# Patient Record
Sex: Male | Born: 1985 | Race: Black or African American | Hispanic: No | Marital: Single | Smoking: Current every day smoker
Health system: Southern US, Community
[De-identification: ages and names within clinical notes are randomized; demographics above are authoritative.]

---
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
## 2006-04-08 ENCOUNTER — Emergency Department (HOSPITAL_COMMUNITY): Admission: EM | Admit: 2006-04-08 | Discharge: 2006-04-08 | Payer: Self-pay | Admitting: Emergency Medicine

## 2009-08-20 ENCOUNTER — Emergency Department (HOSPITAL_COMMUNITY): Admission: EM | Admit: 2009-08-20 | Discharge: 2009-08-20 | Payer: Self-pay | Admitting: Emergency Medicine

## 2010-12-19 ENCOUNTER — Emergency Department (HOSPITAL_COMMUNITY)
Admission: EM | Admit: 2010-12-19 | Discharge: 2010-12-19 | Disposition: A | Discharge status: Home / Self Care | Payer: Self-pay | Attending: Emergency Medicine | Admitting: Emergency Medicine

## 2010-12-19 DIAGNOSIS — R21 Rash and other nonspecific skin eruption: Secondary | ICD-10-CM | POA: Insufficient documentation

## 2010-12-19 DIAGNOSIS — J069 Acute upper respiratory infection, unspecified: Secondary | ICD-10-CM | POA: Insufficient documentation

## 2011-01-01 LAB — URINALYSIS, ROUTINE W REFLEX MICROSCOPIC
Bilirubin Urine: NEGATIVE
Glucose, UA: NEGATIVE mg/dL
Ketones, ur: NEGATIVE mg/dL
Nitrite: NEGATIVE
Specific Gravity, Urine: 1.025 (ref 1.005–1.030)
Urobilinogen, UA: 1 mg/dL (ref 0.0–1.0)
pH: 6 (ref 5.0–8.0)

## 2011-01-01 LAB — URINE MICROSCOPIC-ADD ON

## 2011-04-03 ENCOUNTER — Emergency Department (HOSPITAL_COMMUNITY)
Admission: EM | Admit: 2011-04-03 | Discharge: 2011-04-03 | Disposition: A | Discharge status: Home / Self Care | Payer: Self-pay | Attending: Emergency Medicine | Admitting: Emergency Medicine

## 2011-04-03 DIAGNOSIS — F17200 Nicotine dependence, unspecified, uncomplicated: Secondary | ICD-10-CM | POA: Insufficient documentation

## 2011-04-03 DIAGNOSIS — J329 Chronic sinusitis, unspecified: Secondary | ICD-10-CM | POA: Insufficient documentation

## 2012-02-24 ENCOUNTER — Encounter (HOSPITAL_COMMUNITY): Payer: Self-pay | Admitting: *Deleted

## 2012-02-24 ENCOUNTER — Emergency Department (HOSPITAL_COMMUNITY)
Admission: EM | Admit: 2012-02-24 | Discharge: 2012-02-24 | Disposition: A | Discharge status: Home / Self Care | Payer: Self-pay | Attending: Emergency Medicine | Admitting: Emergency Medicine

## 2012-02-24 DIAGNOSIS — R51 Headache: Secondary | ICD-10-CM | POA: Insufficient documentation

## 2012-02-24 DIAGNOSIS — F17200 Nicotine dependence, unspecified, uncomplicated: Secondary | ICD-10-CM | POA: Insufficient documentation

## 2012-02-24 MED ORDER — HYDROCODONE-ACETAMINOPHEN 5-325 MG PO TABS: 1.0000 | ORAL_TABLET | ORAL | Status: AC | PRN

## 2012-02-24 MED ORDER — MELOXICAM 7.5 MG PO TABS: ORAL_TABLET | ORAL | Status: DC

## 2012-02-24 MED ORDER — PSEUDOEPHEDRINE HCL 60 MG PO TABS: ORAL_TABLET | ORAL | Status: DC

## 2012-02-24 NOTE — ED Provider Notes (Signed)
Medical screening examination/treatment/procedure(s) were performed by non-physician practitioner and as supervising physician I was immediately available for consultation/collaboration.   Glynn Octave, MD 02/24/12 [phone]

## 2012-02-24 NOTE — Discharge Instructions (Signed)
Sinus Headache  A sinus headache is when your sinuses become clogged or swollen. Sinus headaches can range from mild to severe.   CAUSES  A sinus headache can have different causes, such as:   Colds.   Sinus infections.   Allergies.  SYMPTOMS   Symptoms of a sinus headache may vary and can include:   Headache.   Pain or pressure in the face.   Congested or runny nose.   Fever.   Inability to smell.   Pain in upper teeth.  Weather changes can make symptoms worse.  TREATMENT   The treatment of a sinus headache depends on the cause.   Sinus pain caused by a sinus infection may be treated with antibiotic medicine.   Sinus pain caused by allergies may be helped by allergy medicines (antihistamines) and medicated nasal sprays.   Sinus pain caused by congestion may be helped by flushing the nose and sinuses with saline solution.  HOME CARE INSTRUCTIONS    If antibiotics are prescribed, take them as directed. Finish them even if you start to feel better.   Only take over-the-counter or prescription medicines for pain, discomfort, or fever as directed by your caregiver.   If you have congestion, use a nasal spray to help reduce pressure.  SEEK IMMEDIATE MEDICAL CARE IF:   You have a fever.   You have headaches more than once a week.   You have sensitivity to light or sound.   You have repeated nausea and vomiting.   You have vision problems.   You have sudden, severe pain in your face or head.   You have a seizure.   You are confused.   Your sinus headaches do not get better after treatment. Many people think they have a sinus headache when they actually have migraines or tension headaches.  MAKE SURE YOU:    Understand these instructions.   Will watch your condition.   Will get help right away if you are not doing well or get worse.  Document Released: 10/23/2004 Document Revised: 09/04/2011 Document Reviewed: 12/14/2010  [employer] Patient Information 2012 [employer].

## 2012-02-24 NOTE — ED Notes (Signed)
Headache, "feels feverish",  Nausea this am,  No HI.

## 2012-02-24 NOTE — ED Provider Notes (Signed)
History     CSN: [id number]  Arrival date & time 02/24/12  1302   First MD Initiated Contact with Patient 02/24/12 1342      Chief Complaint  Patient presents with  . Headache    (Consider location/radiation/quality/duration/timing/severity/associated sxs/prior treatment) Patient is a 26 y.o. male presenting with headaches. The history is provided by the patient.  Headache  This is a new problem. The current episode started more than 2 days ago. The problem occurs hourly. The problem has not changed since onset.The headache is associated with bright light (nasal congestion). The pain is located in the bilateral region. The quality of the pain is described as dull. The pain is moderate. Associated symptoms include malaise/fatigue. Pertinent negatives include no near-syncope, no palpitations, no syncope, no shortness of breath and no nausea. He has tried nothing for the symptoms.    History reviewed. No pertinent past medical history.  History reviewed. No pertinent past surgical history.  History reviewed. No pertinent family history.  History  Substance Use Topics  . Smoking status: Current Everyday Smoker  . Smokeless tobacco: Not on file  . Alcohol Use: Yes      Review of Systems  Constitutional: Positive for malaise/fatigue. Negative for activity change.       All ROS Neg except as noted in HPI  HENT: Negative for nosebleeds and neck pain.   Eyes: Negative for photophobia and discharge.  Respiratory: Negative for cough, shortness of breath and wheezing.   Cardiovascular: Negative for chest pain, palpitations, syncope and near-syncope.  Gastrointestinal: Negative for nausea, abdominal pain and blood in stool.  Genitourinary: Negative for dysuria, frequency and hematuria.  Musculoskeletal: Negative for back pain and arthralgias.  Skin: Negative.   Neurological: Positive for headaches. Negative for dizziness, seizures and speech difficulty.  Psychiatric/Behavioral:  Negative for hallucinations and confusion.    Allergies  Review of patient's allergies indicates no known allergies.  Home Medications  No current outpatient prescriptions on file.  BP 135/69  Pulse 66  Temp(Src) 98.1 F (36.7 C) (Oral)  Resp 18  Ht 5\' 11"  (1.803 m)  Wt 141 lb 3 oz (64.042 kg)  BMI 19.69 kg/m2  SpO2 97%  Physical Exam  Nursing note and vitals reviewed. Constitutional: He is oriented to person, place, and time. He appears well-developed and well-nourished.  Non-toxic appearance.  HENT:  Head: Normocephalic.  Right Ear: Tympanic membrane and external ear normal.  Left Ear: Tympanic membrane and external ear normal.       Nasal congestion  Eyes: EOM and lids are normal. Pupils are equal, round, and reactive to light.  Neck: Normal range of motion. Neck supple. Carotid bruit is not present.  Cardiovascular: Normal rate, regular rhythm, normal heart sounds, intact distal pulses and normal pulses.   Pulmonary/Chest: Breath sounds normal. No respiratory distress.  Abdominal: Soft. Bowel sounds are normal. There is no tenderness. There is no guarding.  Musculoskeletal: Normal range of motion.  Lymphadenopathy:       Head (right side): No submandibular adenopathy present.       Head (left side): No submandibular adenopathy present.    He has no cervical adenopathy.  Neurological: He is alert and oriented to person, place, and time. He has normal strength. No cranial nerve deficit or sensory deficit. He exhibits normal muscle tone. Coordination normal.  Skin: Skin is warm and dry.  Psychiatric: He has a normal mood and affect. His speech is normal.    ED Course  Procedures (including critical  care time)  Labs Reviewed - No data to display No results found.   No diagnosis found.    MDM  I have reviewed nursing notes, vital signs, and all appropriate lab and imaging results for this patient. Rx for sudafed, norco, and meloxicam given to the  patient.       Kathie Dike, [place] 02/24/12 1410

## 2012-08-29 ENCOUNTER — Encounter (HOSPITAL_COMMUNITY): Payer: Self-pay | Admitting: Emergency Medicine

## 2012-08-29 ENCOUNTER — Emergency Department (HOSPITAL_COMMUNITY)
Admission: EM | Admit: 2012-08-29 | Discharge: 2012-08-29 | Disposition: A | Discharge status: Home / Self Care | Payer: Self-pay | Attending: Emergency Medicine | Admitting: Emergency Medicine

## 2012-08-29 DIAGNOSIS — Y990 Civilian activity done for income or pay: Secondary | ICD-10-CM | POA: Insufficient documentation

## 2012-08-29 DIAGNOSIS — F17200 Nicotine dependence, unspecified, uncomplicated: Secondary | ICD-10-CM | POA: Insufficient documentation

## 2012-08-29 DIAGNOSIS — S46911A Strain of unspecified muscle, fascia and tendon at shoulder and upper arm level, right arm, initial encounter: Secondary | ICD-10-CM

## 2012-08-29 DIAGNOSIS — Y9269 Other specified industrial and construction area as the place of occurrence of the external cause: Secondary | ICD-10-CM | POA: Insufficient documentation

## 2012-08-29 DIAGNOSIS — M545 Low back pain, unspecified: Secondary | ICD-10-CM | POA: Insufficient documentation

## 2012-08-29 DIAGNOSIS — IMO0002 Reserved for concepts with insufficient information to code with codable children: Secondary | ICD-10-CM | POA: Insufficient documentation

## 2012-08-29 DIAGNOSIS — X500XXA Overexertion from strenuous movement or load, initial encounter: Secondary | ICD-10-CM | POA: Insufficient documentation

## 2012-08-29 DIAGNOSIS — Y9389 Activity, other specified: Secondary | ICD-10-CM | POA: Insufficient documentation

## 2012-08-29 MED ORDER — CYCLOBENZAPRINE HCL 10 MG PO TABS: 10.0000 mg | ORAL_TABLET | Freq: Three times a day (TID) | ORAL | Status: DC | PRN

## 2012-08-29 MED ORDER — NAPROXEN 500 MG PO TABS: 500.0000 mg | ORAL_TABLET | Freq: Two times a day (BID) | ORAL | Status: DC

## 2012-08-29 NOTE — ED Notes (Signed)
Pt c/o right shoulder and lower back pain that began yesterday. Pt states pain gradually worsened over day. Pt describes pain as "pulling and tight".

## 2012-08-29 NOTE — ED Provider Notes (Signed)
History     CSN: [id number]  Arrival date & time 08/29/12  1012   First MD Initiated Contact with Patient 08/29/12 1117      Chief Complaint  Patient presents with  . Back Pain  . Shoulder Pain    (Consider location/radiation/quality/duration/timing/severity/associated sxs/prior treatment) HPI Comments: Patient complains of diffuse lower back pain and right shoulder pain that began one day prior to ED arrival. He states the pain began after he was working on a car. 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  Pain is worse with certain movements and improves with rest. He denies numbness or weakness to the upper or lower extremities, neck pain, headache, dizziness, dysuria, incontinence, or saddle anesthesias. He is not taking any over-the-counter medications or tried any therapies to reduce the pain.  Patient is a 26 y.o. male presenting with back pain. The history is provided by the patient.  Back Pain  This is a new problem. Episode onset: day PTA. The problem occurs constantly. The problem has not changed since onset.The pain is associated with lifting heavy objects and twisting. The pain is present in the lumbar spine (right shoulder). The quality of the pain is described as aching. The pain does not radiate. The pain is moderate. The symptoms are aggravated by bending, twisting and certain positions. Pertinent negatives include no chest pain, no fever, no numbness, no headaches, no abdominal pain, no abdominal swelling, no bowel incontinence, no perianal numbness, no dysuria, no pelvic pain, no leg pain, no paresthesias, no paresis, no tingling and no weakness. He has tried nothing for the symptoms. The treatment provided no relief.    History reviewed. No pertinent past medical history.  History reviewed. No pertinent past surgical history.  No family history on file.  History  Substance Use Topics  . Smoking  status: Current Every Day Smoker    Types: Cigarettes  . Smokeless tobacco: Not on file  . Alcohol Use: Yes      Review of Systems  Constitutional: Negative for fever and chills.  HENT: Negative for facial swelling, neck pain and neck stiffness.   Respiratory: Negative for chest tightness and shortness of breath.   Cardiovascular: Negative for chest pain.  Gastrointestinal: Negative for vomiting, abdominal pain, constipation and bowel incontinence.  Genitourinary: Negative for dysuria, hematuria, flank pain, decreased urine volume, difficulty urinating and pelvic pain.       No perineal numbness or incontinence of urine or feces  Musculoskeletal: Positive for back pain and arthralgias. Negative for joint swelling and gait problem.  Skin: Negative for color change, rash and wound.  Neurological: Negative for dizziness, tingling, facial asymmetry, weakness, light-headedness, numbness, headaches and paresthesias.  All other systems reviewed and are negative.    Allergies  Review of patient's allergies indicates no known allergies.  Home Medications  No current outpatient prescriptions on file.  BP 91/69  Pulse 66  Temp 98.2 F (36.8 C) (Oral)  Resp 18  Ht 5\' 11"  (1.803 m)  Wt 145 lb (65.772 kg)  BMI 20.22 kg/m2  SpO2 100%  Physical Exam  Nursing note and vitals reviewed. Constitutional: He is oriented to person, place, and time. He appears well-developed and well-nourished. No distress.  HENT:  Head: Normocephalic and atraumatic.  Neck: Normal range of motion. Neck supple.  Cardiovascular: Normal rate, regular rhythm, normal heart sounds and intact distal pulses.   No murmur heard. Pulmonary/Chest:  Effort normal and breath sounds normal. No respiratory distress. He exhibits tenderness. He exhibits no mass, no bony tenderness, no laceration, no crepitus, no edema, no deformity, no swelling and no retraction.         Localized tenderness to palpation along the upper right  chest wall. No crepitus or edema. Lungs are clear to auscultation bilaterally.  Musculoskeletal: He exhibits tenderness. He exhibits no edema.       Lumbar back: He exhibits tenderness and pain. He exhibits normal range of motion, no swelling, no deformity, no laceration and normal pulse.       Back:       Tenderness to palpation along the right scapular border and right trapezius muscle. He also has tenderness to palpation of the lumbar paraspinal muscles. DP pulses are equal and brisk, distal sensation is intact, radial pulse is also brisk, cap refill is less than 2 seconds. Grip strength is strong and equal bilaterally. Pain to the right shoulder area is reproduced with abduction of the arm.  Neurological: He is alert and oriented to person, place, and time. No cranial nerve deficit or sensory deficit. He exhibits normal muscle tone. Coordination and gait normal.  Reflex Scores:      Tricep reflexes are 2+ on the right side and 2+ on the left side.      Bicep reflexes are 2+ on the right side and 2+ on the left side.      Patellar reflexes are 2+ on the right side and 2+ on the left side.      Achilles reflexes are 2+ on the right side and 2+ on the left side. Skin: Skin is warm and dry.    ED Course  Procedures (including critical care time)  Labs Reviewed - No data to display No results found.      MDM    ttp of the right upper chest wall and along the border of the right scapula.  Pain is reproduced with shoulder movement  Patient has ttp of the lumbar paraspinal muscles.  No focal neuro deficits on exam.  Ambulates with a steady gait.     Doubt emergent neurological or infectious process. Pain is likely related to muscular strain. I will treat with anti-inflammatory and muscle relaxer. Patient agrees to rest and ice to the affected areas.      Fenix Ruppe L. Zyree Traynham, [place] 08/30/12 1750

## 2012-08-29 NOTE — ED Notes (Signed)
Pt c/o lower back pain and shoulder pain since yesterday. Pt denies any injury.

## 2012-08-31 NOTE — ED Provider Notes (Signed)
Medical screening examination/treatment/procedure(s) were performed by non-physician practitioner and as supervising physician I was immediately available for consultation/collaboration.   Dione Booze, MD 08/31/12 [phone]

## 2012-09-21 ENCOUNTER — Emergency Department (HOSPITAL_COMMUNITY)
Admission: EM | Admit: 2012-09-21 | Discharge: 2012-09-21 | Disposition: A | Discharge status: Home / Self Care | Payer: Self-pay | Attending: Emergency Medicine | Admitting: Emergency Medicine

## 2012-09-21 ENCOUNTER — Emergency Department (HOSPITAL_COMMUNITY): Payer: Self-pay

## 2012-09-21 ENCOUNTER — Encounter (HOSPITAL_COMMUNITY): Payer: Self-pay | Admitting: Emergency Medicine

## 2012-09-21 DIAGNOSIS — G8929 Other chronic pain: Secondary | ICD-10-CM | POA: Insufficient documentation

## 2012-09-21 DIAGNOSIS — M25519 Pain in unspecified shoulder: Secondary | ICD-10-CM

## 2012-09-21 DIAGNOSIS — F1210 Cannabis abuse, uncomplicated: Secondary | ICD-10-CM | POA: Insufficient documentation

## 2012-09-21 DIAGNOSIS — F17200 Nicotine dependence, unspecified, uncomplicated: Secondary | ICD-10-CM | POA: Insufficient documentation

## 2012-09-21 MED ORDER — MELOXICAM 7.5 MG PO TABS: ORAL_TABLET | ORAL | Status: DC

## 2012-09-21 MED ORDER — HYDROCODONE-ACETAMINOPHEN 5-325 MG PO TABS: 1.0000 | ORAL_TABLET | ORAL | Status: AC | PRN

## 2012-09-21 NOTE — ED Provider Notes (Signed)
Medical screening examination/treatment/procedure(s) were performed by non-physician practitioner and as supervising physician I was immediately available for consultation/collaboration.   Tanyah Debruyne L Stephens [place], MD 09/21/12 1415 

## 2012-09-21 NOTE — ED Provider Notes (Signed)
History     CSN: [id number]  Arrival date & time 09/21/12  4098   First MD Initiated Contact with Patient 09/21/12 [phone]      Chief Complaint  Patient presents with  . Shoulder Pain    (Consider location/radiation/quality/duration/timing/severity/associated sxs/prior treatment) Patient is a 26 y.o. male presenting with shoulder pain. The history is provided by the patient.  Shoulder Pain This is a chronic problem. The current episode started more than 1 month ago. The problem occurs daily. The problem has been gradually worsening.    History reviewed. No pertinent past medical history.  History reviewed. No pertinent past surgical history.  History reviewed. No pertinent family history.  History  Substance Use Topics  . Smoking status: Current Every Day Smoker    Types: Cigarettes  . Smokeless tobacco: Not on file  . Alcohol Use: Yes     Comment: occ      Review of Systems  Allergies  Review of patient's allergies indicates no known allergies.  Home Medications   Current Outpatient Rx  Name  Route  Sig  Dispense  Refill  . CYCLOBENZAPRINE HCL 10 MG PO TABS   Oral   Take 1 tablet (10 mg total) by mouth 3 (three) times daily as needed for muscle spasms.   21 tablet   0   . NAPROXEN 500 MG PO TABS   Oral   Take 1 tablet (500 mg total) by mouth 2 (two) times daily with a meal.   20 tablet   0     BP 115/72  Pulse 63  Temp 98 F (36.7 C) (Oral)  Resp 16  SpO2 99%  Physical Exam  Musculoskeletal:       Soreness and mild to mod crepitus of the right shoulder with ROM. No dislocation. The shoulder is not hot. Radial pulse symmetrical.  Neurological:       Grip and sensory symmetrical    ED Course  Procedures (including critical care time)  Labs Reviewed - No data to display No results found.   No diagnosis found.    MDM  I have reviewed nursing notes, vital signs, and all appropriate lab and imaging results for this patient. X-ray of the  shoulder is normal. Have advised patient to see the orthopedic physician for additional evaluation concerning his shoulder. Will treat the patient with Mobic and Norco for pain. Patient advised that this is only a temporary treatment and should not be substituted for evaluation and management by an orthopedist.       Kathie Dike, PA 09/21/12 1023

## 2012-09-21 NOTE — ED Notes (Signed)
Pt here x one month ago for same. Pt c/o r shoulder pain. Dx with muscle strain. Unable to f/u with ortho due to finances. Pt states is still a nagging constant pain with rotation and reaching. Nad. Radial pulses strong bilateral, no obvious deformities.

## 2012-12-06 ENCOUNTER — Encounter (HOSPITAL_COMMUNITY): Payer: Self-pay | Admitting: *Deleted

## 2012-12-06 ENCOUNTER — Emergency Department (HOSPITAL_COMMUNITY)
Admission: EM | Admit: 2012-12-06 | Discharge: 2012-12-06 | Disposition: A | Discharge status: Home / Self Care | Payer: Self-pay | Attending: Emergency Medicine | Admitting: Emergency Medicine

## 2012-12-06 DIAGNOSIS — R141 Gas pain: Secondary | ICD-10-CM | POA: Insufficient documentation

## 2012-12-06 DIAGNOSIS — R142 Eructation: Secondary | ICD-10-CM | POA: Insufficient documentation

## 2012-12-06 DIAGNOSIS — F17200 Nicotine dependence, unspecified, uncomplicated: Secondary | ICD-10-CM | POA: Insufficient documentation

## 2012-12-06 DIAGNOSIS — F1210 Cannabis abuse, uncomplicated: Secondary | ICD-10-CM | POA: Insufficient documentation

## 2012-12-06 DIAGNOSIS — E860 Dehydration: Secondary | ICD-10-CM | POA: Insufficient documentation

## 2012-12-06 DIAGNOSIS — R197 Diarrhea, unspecified: Secondary | ICD-10-CM | POA: Insufficient documentation

## 2012-12-06 DIAGNOSIS — R112 Nausea with vomiting, unspecified: Secondary | ICD-10-CM | POA: Insufficient documentation

## 2012-12-06 DIAGNOSIS — R5381 Other malaise: Secondary | ICD-10-CM | POA: Insufficient documentation

## 2012-12-06 DIAGNOSIS — R1084 Generalized abdominal pain: Secondary | ICD-10-CM | POA: Insufficient documentation

## 2012-12-06 LAB — CBC WITH DIFFERENTIAL/PLATELET
Basophils Relative: 0 % (ref 0–1)
Eosinophils Absolute: 0 10*3/uL (ref 0.0–0.7)
HCT: 42.7 % (ref 39.0–52.0)
Hemoglobin: 14.4 g/dL (ref 13.0–17.0)
MCH: 30.3 pg (ref 26.0–34.0)
MCHC: 33.7 g/dL (ref 30.0–36.0)
Monocytes Absolute: 0.4 10*3/uL (ref 0.1–1.0)
Monocytes Relative: 12 % (ref 3–12)
Neutro Abs: 1.6 10*3/uL — ABNORMAL LOW (ref 1.7–7.7)

## 2012-12-06 LAB — BASIC METABOLIC PANEL
BUN: 14 mg/dL (ref 6–23)
Chloride: 103 mEq/L (ref 96–112)
Creatinine, Ser: 0.82 mg/dL (ref 0.50–1.35)
GFR calc Af Amer: >90 mL/min (ref >90)
Glucose, Bld: 95 mg/dL (ref 70–99)

## 2012-12-06 MED ORDER — ONDANSETRON HCL 4 MG/2ML IJ SOLN
4.0000 mg | Freq: Once | INTRAMUSCULAR | Status: AC
  Administered 2012-12-06: 4 mg via INTRAVENOUS
  Filled 2012-12-06: qty 2

## 2012-12-06 MED ORDER — SODIUM CHLORIDE 0.9 % IV SOLN: 1000.0000 mL | INTRAVENOUS | Status: DC

## 2012-12-06 MED ORDER — ONDANSETRON HCL 4 MG PO TABS: 4.0000 mg | ORAL_TABLET | Freq: Four times a day (QID) | ORAL | Status: DC

## 2012-12-06 MED ORDER — SODIUM CHLORIDE 0.9 % IV SOLN
1000.0000 mL | Freq: Once | INTRAVENOUS | Status: AC
  Administered 2012-12-06: 1000 mL via INTRAVENOUS

## 2012-12-06 MED ORDER — ONDANSETRON HCL 4 MG/2ML IJ SOLN
INTRAMUSCULAR | Status: AC
  Administered 2012-12-06: 4 mg via INTRAVENOUS
  Filled 2012-12-06: qty 2

## 2012-12-06 MED ORDER — SODIUM CHLORIDE 0.9 % IV SOLN: 1000.0000 mL | Freq: Once | INTRAVENOUS | Status: DC

## 2012-12-06 MED ORDER — ONDANSETRON HCL 4 MG/2ML IJ SOLN
4.0000 mg | Freq: Once | INTRAMUSCULAR | Status: AC
  Administered 2012-12-06: 4 mg via INTRAVENOUS

## 2012-12-06 MED ORDER — SODIUM CHLORIDE 0.9 % IV BOLUS (SEPSIS): 1000.0000 mL | Freq: Once | INTRAVENOUS | Status: DC

## 2012-12-06 NOTE — ED Notes (Signed)
Pt reports still feeling nauseated.  meds given.

## 2012-12-06 NOTE — ED Provider Notes (Signed)
History    This chart was scribed for Ward Givens, MD by Charolett Bumpers, ED Scribe. The patient was seen in room APA05/[facility]05. Patient's care was started at 1206.   CSN: [id number]  Arrival date & time 12/06/12  1030   First MD Initiated Contact with Patient 12/06/12 1206      Chief Complaint  Patient presents with  . Emesis    The history is provided by the patient. No language interpreter was used.   Jeremy Sullivan is a 27 y.o. male who presents to the Emergency Department complaining of 2 days of persistent, moderate vomiting and diarrhea. He reports vomiting twice last night and once this morning. He reports one episode of diarrhea last night and 2 episodes this morning. He also complains of diffuse abdominal cramping pain with gas, generalized weakness. He denies any dizziness, light-headedness. He is unsure of fever. He denies any sick contacts or suspect food exposures. He states that he is otherwise healthy and doesn't have a PCP.   PCP none  History reviewed. No pertinent past medical history.  History reviewed. No pertinent past surgical history.  History reviewed. No pertinent family history.  History  Substance Use Topics  . Smoking status: Current Every Day Smoker    Types: Cigarettes  . Smokeless tobacco: Not on file  . Alcohol Use: Yes     Comment: occ  He reports smoking a pack over 2 weeks.  He works at [employer] He denies alcohol use since getting sick.    Review of Systems  Gastrointestinal: Positive for nausea, vomiting, abdominal pain and diarrhea.  Neurological: Positive for weakness. Negative for dizziness and light-headedness.  All other systems reviewed and are negative.    Allergies  Review of patient's allergies indicates no known allergies.  Home Medications   None  BP 105/81  Pulse 68  Temp(Src) 97.9 F (36.6 C) (Oral)  Resp 18  Ht 5\' 11"  (1.803 m)  Wt 162 lb (73.483 kg)  BMI 22.6 kg/m2  SpO2 100%  Vital signs  normal    Physical Exam  Nursing note and vitals reviewed. Constitutional: He is oriented to person, place, and time. He appears well-developed and well-nourished.  HENT:  Head: Normocephalic and atraumatic.  Right Ear: External ear normal.  Left Ear: External ear normal.  Nose: Nose normal.  Mouth/Throat: No oropharyngeal exudate.  Tongue is dry.   Eyes: Conjunctivae and EOM are normal. Pupils are equal, round, and reactive to light.  Neck: Normal range of motion. Neck supple. No tracheal deviation present.  Cardiovascular: Normal rate, regular rhythm and normal heart sounds.  Exam reveals no gallop and no friction rub.   No murmur heard. Pulmonary/Chest: Effort normal and breath sounds normal. No respiratory distress. He has no wheezes. He has no rales.  Abdominal: Soft. Bowel sounds are normal. He exhibits no distension. There is no tenderness. There is no rebound and no guarding.  Musculoskeletal: Normal range of motion. He exhibits no tenderness.  Neurological: He is alert and oriented to person, place, and time.  Skin: Skin is warm and dry.  Psychiatric: He has a normal mood and affect. His behavior is normal.    ED Course  Procedures (including critical care time)  Medications  0.9 %  sodium chloride infusion (1,000 mLs Intravenous New Bag/Given 12/06/12 1226)    Followed by  0.9 %  sodium chloride infusion (not administered)    Followed by  0.9 %  sodium chloride infusion (not administered)  0.9 %  sodium chloride infusion (0 mLs Intravenous Stopped 12/06/12 1226)  ondansetron (ZOFRAN) injection 4 mg (4 mg Intravenous Given 12/06/12 1135)  ondansetron (ZOFRAN) injection 4 mg (4 mg Intravenous Given 12/06/12 1226)     DIAGNOSTIC STUDIES: Oxygen Saturation is 100% on room air, normal by my interpretation.    COORDINATION OF CARE:  12:15-Discussed planned course of treatment with the patient including IV fluids, nausea management and blood work, who is agreeable at this  time.   Recheck 13:10 nausea better, has almost gotten 2 liters of NS, no urine output yet. Feels ready to drink, pt brought a bottle of gatorade with him, given to patient.   At discharge he has been drinking gatorade, has had urine output and feels better.   Results for orders placed during the hospital encounter of 12/06/12  CBC WITH DIFFERENTIAL      Result Value Range   WBC 3.2 (*) 4.0 - 10.5 K/uL   RBC 4.75  4.22 - 5.81 MIL/uL   Hemoglobin 14.4  13.0 - 17.0 g/dL   HCT 16.1  09.6 - 04.5 %   MCV 89.9  78.0 - 100.0 fL   MCH 30.3  26.0 - 34.0 pg   MCHC 33.7  30.0 - 36.0 g/dL   RDW 40.9  81.1 - 91.4 %   Platelets 179  150 - 400 K/uL   Neutrophils Relative 50  43 - 77 %   Neutro Abs 1.6 (*) 1.7 - 7.7 K/uL   Lymphocytes Relative 37  12 - 46 %   Lymphs Abs 1.2  0.7 - 4.0 K/uL   Monocytes Relative 12  3 - 12 %   Monocytes Absolute 0.4  0.1 - 1.0 K/uL   Eosinophils Relative 0  0 - 5 %   Eosinophils Absolute 0.0  0.0 - 0.7 K/uL   Basophils Relative 0  0 - 1 %   Basophils Absolute 0.0  0.0 - 0.1 K/uL  BASIC METABOLIC PANEL      Result Value Range   Sodium 140  135 - 145 mEq/L   Potassium 4.3  3.5 - 5.1 mEq/L   Chloride 103  96 - 112 mEq/L   CO2 29  19 - 32 mEq/L   Glucose, Bld 95  70 - 99 mg/dL   BUN 14  6 - 23 mg/dL   Creatinine, Ser 7.82  0.50 - 1.35 mg/dL   Calcium 9.5  8.4 - 95.6 mg/dL   GFR calc non Af Amer >90  >90 mL/min   GFR calc Af Amer >90  >90 mL/min   Laboratory interpretation all normal except low WBC without neutropenia    1. Vomiting and diarrhea   2. Dehydration    New Prescriptions   ONDANSETRON (ZOFRAN) 4 MG TABLET    Take 1 tablet (4 mg total) by mouth every 6 (six) hours.    Plan discharge  Devoria Albe, MD, FACEP    MDM    I personally performed the services described in this documentation, which was scribed in my presence. The recorded information has been reviewed and considered.  Devoria Albe, MD, [facility]       Ward Givens, MD 12/06/12  [phone]

## 2012-12-06 NOTE — ED Notes (Signed)
Vomiting, diarrhea,  No fever,abd cramping.

## 2012-12-15 ENCOUNTER — Emergency Department (HOSPITAL_COMMUNITY)
Admission: EM | Admit: 2012-12-15 | Discharge: 2012-12-15 | Disposition: A | Discharge status: Home / Self Care | Payer: Self-pay | Attending: Emergency Medicine | Admitting: Emergency Medicine

## 2012-12-15 ENCOUNTER — Encounter (HOSPITAL_COMMUNITY): Payer: Self-pay

## 2012-12-15 DIAGNOSIS — M79609 Pain in unspecified limb: Secondary | ICD-10-CM | POA: Insufficient documentation

## 2012-12-15 DIAGNOSIS — F17200 Nicotine dependence, unspecified, uncomplicated: Secondary | ICD-10-CM | POA: Insufficient documentation

## 2012-12-15 MED ORDER — IBUPROFEN 800 MG PO TABS: 800.0000 mg | ORAL_TABLET | Freq: Three times a day (TID) | ORAL | Status: DC

## 2012-12-15 NOTE — ED Notes (Signed)
Pt reports being here last Monday and received and IV in his left forearm.  Pt reports since he was d/c c/o pain to the area.  Pt reports "it feels like electricity running through my arm".

## 2012-12-15 NOTE — ED Provider Notes (Signed)
History     CSN: [id number]  Arrival date & time 12/15/12  6295   First MD Initiated Contact with Patient 12/15/12 1008      Chief Complaint  Patient presents with  . Arm Pain    (Consider location/radiation/quality/duration/timing/severity/associated sxs/prior treatment) HPI Jeremy Sullivan is a 27 y.o. male who presents to the ED with left arm pain. The pain started about a week ago. He was here for dehydration 2 days ago and had an IV in his left arm. He reports pain sometimes when he reaches up or presses on the area. The pain starts at the area of the forearm where the IV was and radiates to the wrist. He has taken nothing for the pain. The history was provided by thepatient.   History reviewed. No pertinent past medical history.  History reviewed. No pertinent past surgical history.  No family history on file.  History  Substance Use Topics  . Smoking status: Current Every Day Smoker    Types: Cigarettes  . Smokeless tobacco: Not on file  . Alcohol Use: Yes     Comment: occ      Review of Systems  Constitutional: Negative for fever and chills.  Cardiovascular: Negative for chest pain.  Gastrointestinal: Negative for nausea and vomiting.  Skin: Negative for wound.  Allergic/Immunologic: Negative for immunocompromised state.  Neurological: Negative for headaches.  Psychiatric/Behavioral: Negative for confusion. The patient is not nervous/anxious.     Allergies  Review of patient's allergies indicates no known allergies.  Home Medications  No current outpatient prescriptions on file.  BP 116/63  Pulse 64  Temp(Src) 98 F (36.7 C) (Oral)  Resp 15  SpO2 100%  Physical Exam  Nursing note and vitals reviewed. Constitutional: He is oriented to person, place, and time. He appears well-developed and well-nourished. No distress.  Eyes: EOM are normal.  Neck: Neck supple.  Cardiovascular: Normal rate.   Pulmonary/Chest: Effort normal.  Musculoskeletal:      Left forearm: He exhibits tenderness. He exhibits no swelling and no deformity.  There is mild tenderness mid forearm with palpation. There is not erythema, warmth, swelling or signs of infection. Radial pulse is strong, good touch sensation, adequate circulation.  Neurological: He is alert and oriented to person, place, and time.  Skin: Skin is warm and dry.  Psychiatric: He has a normal mood and affect. His behavior is normal. Judgment and thought content normal.   Assessment: 27 y.o. male with arm soreness after IV fluids 2 days ago    Plan:  Ibuprofen   Warm compresses, return as needed ED Course  Procedures (including critical care time)   MDM  Discussed with the patient and all questioned fully answered. He will return if any problems arise.    Medication List    TAKE these medications       ibuprofen 800 MG tablet  Commonly known as:  ADVIL,MOTRIN  Take 1 tablet (800 mg total) by mouth 3 (three) times daily.               [facility] Orlene Och, NP 12/15/12 1640

## 2012-12-18 NOTE — ED Provider Notes (Signed)
Medical screening examination/treatment/procedure(s) were performed by non-physician practitioner and as supervising physician I was immediately available for consultation/collaboration.   Ruthella Kirchman W Willine Schwalbe, MD 12/18/12 0409 

## 2018-04-06 ENCOUNTER — Emergency Department (HOSPITAL_COMMUNITY)
Admission: EM | Admit: 2018-04-06 | Discharge: 2018-04-06 | Disposition: A | Discharge status: Home / Self Care | Payer: Self-pay | Attending: Emergency Medicine | Admitting: Emergency Medicine

## 2018-04-06 ENCOUNTER — Encounter (HOSPITAL_COMMUNITY): Payer: Self-pay | Admitting: Emergency Medicine

## 2018-04-06 DIAGNOSIS — K0889 Other specified disorders of teeth and supporting structures: Secondary | ICD-10-CM

## 2018-04-06 DIAGNOSIS — F17210 Nicotine dependence, cigarettes, uncomplicated: Secondary | ICD-10-CM | POA: Insufficient documentation

## 2018-04-06 DIAGNOSIS — K0500 Acute gingivitis, plaque induced: Secondary | ICD-10-CM

## 2018-04-06 DIAGNOSIS — K0501 Acute gingivitis, non-plaque induced: Secondary | ICD-10-CM | POA: Insufficient documentation

## 2018-04-06 DIAGNOSIS — K089 Disorder of teeth and supporting structures, unspecified: Secondary | ICD-10-CM | POA: Insufficient documentation

## 2018-04-06 NOTE — Discharge Instructions (Addendum)
You were seen in the ER for dental pain.   You have a broken tooth with surrounding gumline irritation.    Acetaminophnen, ibuprofen, oragel for pain.   Avoid hard foods to avoid further irritation to broken teeth.   Return to ER for swelling, fevers, chills, drainage/pus from gumline

## 2018-04-06 NOTE — ED Triage Notes (Signed)
Patient complaining of right lower dental pain x 2 months worsening last night.

## 2018-04-06 NOTE — ED Notes (Signed)
Pt and mother walked out before discharge paper where printed.

## 2018-04-06 NOTE — ED Provider Notes (Signed)
[facility] EMERGENCY DEPARTMENT Provider Note   CSN: [id number] Arrival date & time: 04/06/18  1204  History   Chief Complaint Chief Complaint  Patient presents with  . Dental Pain    HPI Jeremy Sullivan is a 32 y.o. male here for evaluation of dental pain, mild, intermittent for several months but acutely worsening last night. Pain is at lower front gumline.  Associated with cold/heat sensitivity. States last night he brushed his teeth "hard" and for a long time to help with infection. Has been taking ibuprofen without relief. Denies fevers, chills, facial swelling, drainage, difficulty opening jaw, drooling. No dental f/u established. Pain aggravated with palpation. Pain moderate, non radiating, gradual.  HPI  History reviewed. No pertinent past medical history.  There are no active problems to display for this patient.   History reviewed. No pertinent surgical history.      Home Medications    Prior to Admission medications   Medication Sig Start Date End Date Taking? Authorizing Provider  ibuprofen (ADVIL,MOTRIN) 800 MG tablet Take 1 tablet (800 mg total) by mouth 3 (three) times daily. 12/15/12   Janne NapoleonNeese, Hope M, NP    Family History No family history on file.  Social History Social History   Tobacco Use  . Smoking status: Current Every Day Smoker    Types: Cigarettes  . Smokeless tobacco: Never Used  Substance Use Topics  . Alcohol use: Yes    Comment: occ  . Drug use: Yes    Types: Marijuana     Allergies   Patient has no known allergies.   Review of Systems Review of Systems  HENT: Positive for dental problem.   All other systems reviewed and are negative.    Physical Exam Updated Vital Signs BP (!) 149/97 (BP Location: Right Arm)   Pulse 61   Temp 98.9 F (37.2 C) (Temporal)   Resp 14   Ht 6' (1.829 m)   Wt 68 kg (150 lb)   SpO2 100%   BMI 20.34 kg/m   Physical Exam  Constitutional: He is oriented to person, place, and time. He  appears well-developed and well-nourished.  Non-toxic appearance.  HENT:  Head: Normocephalic.  Right Ear: External ear normal.  Left Ear: External ear normal.  Nose: Nose normal.  Tooth #28 is cracked vertically at posterior aspect.  Inflamed gingiva/gumline on buccal side of teeth #27-30, some skin is peeling on this area (pt states he brushed this area forcefully to help with dental pain). No edema, fluctuance, drainage. No trismus. No SL tenderness or edema, normal protrusion of tongue. Oropharynx and tonsils normal. No facial or neck edema. No hot potato voice, stridor, drooling  Eyes: Conjunctivae and EOM are normal.  Neck: Full passive range of motion without pain.  Cardiovascular: Normal rate.  Pulmonary/Chest: Effort normal. No tachypnea. No respiratory distress.  Musculoskeletal: Normal range of motion.  Neurological: He is alert and oriented to person, place, and time.  Skin: Skin is warm and dry. Capillary refill takes less than 2 seconds.  Psychiatric: His behavior is normal. Thought content normal.     ED Treatments / Results  Labs (all labs ordered are listed, but only abnormal results are displayed) Labs Reviewed - No data to display  EKG None  Radiology No results found.  Procedures Procedures (including critical care time)  Medications Ordered in ED Medications - No data to display   Initial Impression / Assessment and Plan / ED Course  I have reviewed the triage vital  signs and the nursing notes.  Pertinent labs & imaging results that were available during my care of the patient were reviewed by me and considered in my medical decision making (see chart for details).     Dental pain associated with cracked tooth and superficial gum irritation from forceful brushing, but no signs or symptoms of dental abscess on exam with patient afebrile, non toxic appearing, swallowing secretions well without hot potato voice. Exam unconcerning for Ludwig's angina or  other deep tissue infection in neck. As there is no significant gum swelling with fluctuance, erythema, facial swelling, fevers, chills, will treat no treat with antibiotic.  High dose NSAIDs, oragel, warm rinses encouraged. Urged patient to follow-up with dentist.  Patient given dentist contact info, encouraged to follow up in 1-2 days for ultimate management of dental pain and overall dental health. Strict ED return precautions given. Pt is aware of red flag symptoms that would warrant return to ED for re-evaluation and further treatment. Patient voices understanding and is agreeable to plan.    Final Clinical Impressions(s) / ED Diagnoses   Final diagnoses:  Pain, dental  Acute gingival inflammation    ED Discharge Orders    None       Jerrell Mylar 04/06/18 2017    Bethann Berkshire, MD 04/07/18 1531

## 2018-07-25 ENCOUNTER — Emergency Department (HOSPITAL_COMMUNITY)
Admission: EM | Admit: 2018-07-25 | Discharge: 2018-07-25 | Disposition: A | Discharge status: Home / Self Care | Payer: No Typology Code available for payment source | Attending: Emergency Medicine | Admitting: Emergency Medicine

## 2018-07-25 ENCOUNTER — Emergency Department (HOSPITAL_COMMUNITY): Payer: No Typology Code available for payment source

## 2018-07-25 ENCOUNTER — Other Ambulatory Visit: Payer: Self-pay

## 2018-07-25 ENCOUNTER — Encounter (HOSPITAL_COMMUNITY): Payer: Self-pay | Admitting: Emergency Medicine

## 2018-07-25 DIAGNOSIS — Z79899 Other long term (current) drug therapy: Secondary | ICD-10-CM | POA: Diagnosis not present

## 2018-07-25 DIAGNOSIS — Y92410 Unspecified street and highway as the place of occurrence of the external cause: Secondary | ICD-10-CM | POA: Diagnosis not present

## 2018-07-25 DIAGNOSIS — F17210 Nicotine dependence, cigarettes, uncomplicated: Secondary | ICD-10-CM | POA: Insufficient documentation

## 2018-07-25 DIAGNOSIS — S0083XA Contusion of other part of head, initial encounter: Secondary | ICD-10-CM | POA: Insufficient documentation

## 2018-07-25 DIAGNOSIS — Z23 Encounter for immunization: Secondary | ICD-10-CM | POA: Insufficient documentation

## 2018-07-25 DIAGNOSIS — M79641 Pain in right hand: Secondary | ICD-10-CM | POA: Insufficient documentation

## 2018-07-25 DIAGNOSIS — Y9389 Activity, other specified: Secondary | ICD-10-CM | POA: Insufficient documentation

## 2018-07-25 DIAGNOSIS — Y999 Unspecified external cause status: Secondary | ICD-10-CM | POA: Insufficient documentation

## 2018-07-25 DIAGNOSIS — S0990XA Unspecified injury of head, initial encounter: Secondary | ICD-10-CM | POA: Diagnosis present

## 2018-07-25 DIAGNOSIS — S01112A Laceration without foreign body of left eyelid and periocular area, initial encounter: Secondary | ICD-10-CM | POA: Insufficient documentation

## 2018-07-25 LAB — RAPID URINE DRUG SCREEN, HOSP PERFORMED
Amphetamines: NOT DETECTED
Barbiturates: NOT DETECTED
Benzodiazepines: NOT DETECTED
COCAINE: NOT DETECTED
OPIATES: NOT DETECTED
Tetrahydrocannabinol: POSITIVE — AB

## 2018-07-25 MED ORDER — CEPHALEXIN 500 MG PO CAPS: 500.0000 mg | ORAL_CAPSULE | Freq: Two times a day (BID) | ORAL | 0 refills | Status: AC

## 2018-07-25 MED ORDER — TETANUS-DIPHTH-ACELL PERTUSSIS 5-2.5-18.5 LF-MCG/0.5 IM SUSP
0.5000 mL | Freq: Once | INTRAMUSCULAR | Status: AC
  Administered 2018-07-25: 0.5 mL via INTRAMUSCULAR
  Filled 2018-07-25: qty 0.5

## 2018-07-25 MED ORDER — TETRACAINE HCL 0.5 % OP SOLN
OPHTHALMIC | Status: AC
  Filled 2018-07-25: qty 4

## 2018-07-25 MED ORDER — TETRACAINE HCL 0.5 % OP SOLN
2.0000 [drp] | Freq: Once | OPHTHALMIC | Status: AC
  Administered 2018-07-25: 2 [drp] via OPHTHALMIC

## 2018-07-25 MED ORDER — FLUORESCEIN SODIUM 1 MG OP STRP
1.0000 | ORAL_STRIP | Freq: Once | OPHTHALMIC | Status: AC
  Administered 2018-07-25: 1 via OPHTHALMIC
  Filled 2018-07-25: qty 1

## 2018-07-25 MED ORDER — ACETAMINOPHEN 325 MG PO TABS
650.0000 mg | ORAL_TABLET | Freq: Once | ORAL | Status: AC
  Administered 2018-07-25: 650 mg via ORAL
  Filled 2018-07-25: qty 2

## 2018-07-25 NOTE — ED Notes (Signed)
EDP at bedside updating patient and family. 
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
## 2018-07-25 NOTE — ED Notes (Signed)
Pt transported to radiology.

## 2018-07-25 NOTE — ED Notes (Signed)
EDP at bedside  
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
## 2018-07-25 NOTE — ED Notes (Signed)
Dr. Zackowski at bedside  

## 2018-07-25 NOTE — ED Notes (Signed)
Cleaned wounds with sur clens.

## 2018-07-25 NOTE — ED Notes (Signed)
Pt returned from xray

## 2018-07-25 NOTE — Discharge Instructions (Addendum)
Please meet Dr. Sherrine Maples at [facility] at the address provided for you above @ 6:30 pm in his office.All your imaging today was normal.I have prescribed antibiotics for your injury, please take 1 tablet twice a day for the next 7 days. Please follow up with your PCP as needed.

## 2019-09-14 ENCOUNTER — Ambulatory Visit: Payer: HRSA Program | Attending: Internal Medicine

## 2019-09-14 ENCOUNTER — Other Ambulatory Visit: Payer: Self-pay

## 2019-09-14 DIAGNOSIS — Z20828 Contact with and (suspected) exposure to other viral communicable diseases: Secondary | ICD-10-CM | POA: Insufficient documentation

## 2019-09-14 DIAGNOSIS — Z20822 Contact with and (suspected) exposure to covid-19: Secondary | ICD-10-CM

## 2019-09-15 LAB — NOVEL CORONAVIRUS, NAA: SARS-CoV-2, NAA: NOT DETECTED

## 2019-09-15 NOTE — Progress Notes (Signed)
Moving orders to this encounter.  

## 2019-09-15 NOTE — Progress Notes (Signed)
Order(s) created erroneously. Erroneous order ID: [id number]  Order moved by: Brigitte Pulse  Order move date/time: 09/15/2019 1:38 PM  Source Patient: [id number]  Source Contact: 09/14/2019  Destination Patient: [id number]  Destination Contact: 09/14/2019

## 2019-09-15 NOTE — Progress Notes (Signed)
Order(s) created erroneously. Erroneous order ID: [id number]  Order moved by: Laqueshia Cihlar M  Order move date/time: 09/15/2019 1:38 PM  Source Patient: [id number]  Source Contact: 09/14/2019  Destination Patient: [id number]  Destination Contact: 09/14/2019 

## 2020-02-15 ENCOUNTER — Other Ambulatory Visit: Payer: Self-pay

## 2020-02-16 ENCOUNTER — Other Ambulatory Visit: Payer: Self-pay

## 2020-02-16 ENCOUNTER — Ambulatory Visit: Payer: Self-pay | Attending: Internal Medicine

## 2020-02-16 DIAGNOSIS — Z20822 Contact with and (suspected) exposure to covid-19: Secondary | ICD-10-CM

## 2020-02-17 LAB — NOVEL CORONAVIRUS, NAA: SARS-CoV-2, NAA: NOT DETECTED

## 2020-02-17 LAB — SARS-COV-2, NAA 2 DAY TAT

## 2022-02-10 ENCOUNTER — Emergency Department (HOSPITAL_COMMUNITY): Payer: Self-pay

## 2022-02-10 ENCOUNTER — Encounter (HOSPITAL_COMMUNITY): Payer: Self-pay

## 2022-02-10 ENCOUNTER — Emergency Department (HOSPITAL_COMMUNITY)
Admission: EM | Admit: 2022-02-10 | Discharge: 2022-02-10 | Disposition: A | Discharge status: Home / Self Care | Payer: Self-pay | Attending: Emergency Medicine | Admitting: Emergency Medicine

## 2022-02-10 ENCOUNTER — Other Ambulatory Visit: Payer: Self-pay

## 2022-02-10 DIAGNOSIS — M25562 Pain in left knee: Secondary | ICD-10-CM

## 2022-02-10 DIAGNOSIS — Y990 Civilian activity done for income or pay: Secondary | ICD-10-CM | POA: Insufficient documentation

## 2022-02-10 DIAGNOSIS — X500XXA Overexertion from strenuous movement or load, initial encounter: Secondary | ICD-10-CM | POA: Insufficient documentation

## 2022-02-10 DIAGNOSIS — M5442 Lumbago with sciatica, left side: Secondary | ICD-10-CM | POA: Insufficient documentation

## 2022-02-10 LAB — URINALYSIS, ROUTINE W REFLEX MICROSCOPIC
Bacteria, UA: NONE SEEN
Bilirubin Urine: NEGATIVE
Glucose, UA: NEGATIVE mg/dL
Ketones, ur: NEGATIVE mg/dL
Leukocytes,Ua: NEGATIVE
Nitrite: NEGATIVE
Protein, ur: NEGATIVE mg/dL
Specific Gravity, Urine: 1.026 (ref 1.005–1.030)
pH: 5 (ref 5.0–8.0)

## 2022-02-10 MED ORDER — LIDOCAINE 5 % EX PTCH
2.0000 | MEDICATED_PATCH | CUTANEOUS | Status: DC
  Administered 2022-02-10: 2 via TRANSDERMAL
  Filled 2022-02-10: qty 2

## 2022-02-10 MED ORDER — LIDOCAINE 5 % EX PTCH
2.0000 | MEDICATED_PATCH | CUTANEOUS | 0 refills | Status: AC
Start: 2022-02-10 — End: ?

## 2022-02-10 MED ORDER — IBUPROFEN 600 MG PO TABS: 600.0000 mg | ORAL_TABLET | Freq: Four times a day (QID) | ORAL | 0 refills | Status: AC | PRN

## 2022-02-10 MED ORDER — METHOCARBAMOL 500 MG PO TABS: 500.0000 mg | ORAL_TABLET | Freq: Two times a day (BID) | ORAL | 0 refills | Status: AC

## 2022-02-10 MED ORDER — KETOROLAC TROMETHAMINE 60 MG/2ML IM SOLN
15.0000 mg | Freq: Once | INTRAMUSCULAR | Status: AC
  Administered 2022-02-10: 15 mg via INTRAMUSCULAR
  Filled 2022-02-10: qty 2

## 2022-02-10 NOTE — ED Triage Notes (Signed)
Patient with complaints of lower back and knee pain without injury that started the previous day.  ?

## 2022-02-10 NOTE — ED Provider Notes (Signed)
?[facility] EM[facility] DEPARTMENT ?Provider Note ? ? ?CSN: [id number] ?Arrival date & time: 02/10/22  9381 ? ?  ? ?History ? ?Chief Complaint  ?Patient presents with  ? Back Pain  ? ? ?Jeremy Sullivan is a 36 y.o. male presents to the ED for evaluation of lower back pain off and on since yesterday. He denies any known trauma, fall, or inciting incident. 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  He reports that he is not sure if the pain is radiating down his left knee, or if he is having left knee pain.  He reports he does do a lot of kneeling during his job as well.  He denies any urinary or fecal incontinence.  Denies any urinary retention.  Denies any dysuria or hematuria.  Denies any numbness or tingling.  He denies any fevers or any history of IV drug use. ? ? ?Back Pain ?Associated symptoms: no abdominal pain, no chest pain, no dysuria, no fever, no numbness and no weakness   ? ?  ? ?Home Medications ?Prior to Admission medications   ?Medication Sig Start Date End Date Taking? Authorizing Provider  ?ibuprofen (ADVIL) 600 MG tablet Take 1 tablet (600 mg total) by mouth every 6 (six) hours as needed. 02/10/22  Yes Achille Rich, PA-C  ?lidocaine (LIDODERM) 5 % Place 2 patches onto the skin daily. Remove & Discard patch within 12 hours or as directed by MD 02/10/22  Yes Achille Rich, PA-C  ?methocarbamol (ROBAXIN) 500 MG tablet Take 1 tablet (500 mg total) by mouth 2 (two) times daily. 02/10/22  Yes Achille Rich, PA-C  ?   ? ?Allergies    ?Patient has no known allergies.   ? ?Review of Systems   ?Review of Systems  ?Constitutional:  Negative for chills and fever.  ?Respiratory:  Negative for shortness of breath.   ?Cardiovascular:  Negative for chest pain.  ?Gastrointestinal:  Negative for abdominal pain, constipation, diarrhea, nausea and vomiting.  ?Genitourinary:  Negative for  decreased urine volume, difficulty urinating, dysuria, enuresis, hematuria and urgency.  ?Musculoskeletal:  Positive for arthralgias and back pain. Negative for joint swelling.  ?Neurological:  Negative for weakness and numbness.  ? ?Physical Exam ?Updated Vital Signs ?BP 107/74 (BP Location: Right Arm)   Pulse 60   Temp 97.8 ?F (36.6 ?C)   Resp 20   Ht 5\' 11"  (1.803 m)   Wt 68 kg   SpO2 99%   BMI 20.92 kg/m?  ?Physical Exam ?Vitals and nursing note reviewed.  ?Constitutional:   ?   General: He is not in acute distress. ?   Appearance: Normal appearance. He is not ill-appearing or toxic-appearing.  ?Eyes:  ?   General: No scleral icterus. ?Pulmonary:  ?   Effort: Pulmonary effort is normal. No respiratory distress.  ?Abdominal:  ?   Tenderness: There is no abdominal tenderness. There is no guarding or rebound.  ?Musculoskeletal:  ?   Right lower leg: No edema.  ?   Left lower leg: No edema.  ?   Comments: No midline cervical, thoracic, or lumbar tenderness palpation.  No bony step-offs or deformities.  Patient has lower bilateral paraspinal lumbar tenderness palpation.  There is no overlying skin changes, increased warmth, or erythema to the area.  Strength is 5-5 in patient's upper and lower bilateral extremities.  Sensation intact throughout.  He has palpable radial, DP, and PT pulses.  Left knee is nontender to palpation.  Tattoos are the only overlying skin change noted.  Compartments are soft. The patient can change positions from sitting to standing with ease and is ambulatory with ease as well.  ?Skin: ?   General: Skin is dry.  ?   Findings: No rash.  ?Neurological:  ?   General: No focal deficit present.  ?   Mental Status: He is alert. Mental status is at baseline.  ?   Sensory: No sensory deficit.  ?   Motor: No weakness.  ?   Gait: Gait normal.  ?Psychiatric:     ?   Mood and Affect: Mood normal.  ? ? ?ED Results / Procedures / Treatments   ?Labs ?(all labs ordered are listed, but only abnormal  results are displayed) ?Labs Reviewed  ?URINALYSIS, ROUTINE W REFLEX MICROSCOPIC - Abnormal; Notable for the following components:  ?    Result Value  ? Hgb urine dipstick SMALL (*)   ? All other components within normal limits  ? ? ?EKG ?None ? ?Radiology ?DG Lumbar Spine Complete ? ?Result Date: 02/10/2022 ?CLINICAL DATA:  Low back pain with left-sided radiculopathy EXAM: LUMBAR SPINE - COMPLETE 4+ VIEW COMPARISON:  None Available. FINDINGS: There is no evidence of lumbar spine fracture. Alignment is normal. Intervertebral disc spaces are maintained. Facet joints appear within normal limits. IMPRESSION: Negative. Electronically Signed   By: Duanne Guess D.O.   On: 02/10/2022 09:31  ? ?DG Knee Complete 4 Views Left ? ?Result Date: 02/10/2022 ?CLINICAL DATA:  36 year old male presenting with pain in the LEFT lower back into LEFT knee for 2 days without history of injury. EXAM: LEFT KNEE - COMPLETE 4+ VIEW COMPARISON:  Lumbar spine evaluation of the same date. No additional prior imaging for comparison. FINDINGS: No acute fracture or sign of dislocation. No joint effusion or soft tissue swelling. Benign appearing chondroid lesion in the distal LEFT femur incompletely imaged on some views. IMPRESSION: 1. No acute abnormality in the LEFT knee. 2. Benign-appearing chondroid lesion in the distal LEFT femur incompletely imaged on some views. If pain is localized mainly to the distal thigh and knee could consider dedicated femoral evaluation for complete assessment of this lesion and or short interval follow-up. Finding is likely a small enchondroma. Electronically Signed   By: Donzetta Kohut M.D.   On: 02/10/2022 09:54  ? ?DG Femur Min 2 Views Left ? ?Result Date: 02/10/2022 ?CLINICAL DATA:  Chondroid lesion in the left distal femur on knee radiographs with partial exclusion of the lesion on some views. Today's femur exam is for further characterization. EXAM: LEFT FEMUR 2 VIEWS COMPARISON:  Knee radiographs 02/10/2022.  FINDINGS: Benign appearing 2.7 by 1.4 by 1.0 cm lesion with chondroid matrix in the distal femoral diaphysis corresponding to the lesion partially characterized on the radiographs. No endosteal scalloping, pathologic fracture, periosteal reaction, or transition zone abnormality the appearance is compatible with enchondroma and does not require further workup. No other femur lesion is identified. IMPRESSION: 1. 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 This lesion does not require further workup. Electronically Signed   By: Gaylyn Rong M.D.   On: 02/10/2022 10:59   ? ?Procedures ?Procedures  ? ?Medications Ordered in ED ?Medications  ?ketorolac (TORADOL) injection 15 mg (15 mg Intramuscular Given 02/10/22 1249)  ? ? ?ED Course/ Medical Decision Making/ A&P ?  ?                        ?  Medical Decision Making ?Amount and/or Complexity of Data Reviewed ?Labs: ordered. ?Radiology: ordered. ? ?Risk ?Prescription drug management. ? ? ? ?36 year old male presents the emergency department for evaluation of bilateral lower back pain.  Differential diagnosis includes but is not limited to sprain, strain, cauda equina, fracture, arthritis, epidural abscess, MSK.  Vital signs are reassuring.  Patient is normotensive, afebrile, normal pulse rate, satting well on room air without any increased work of breathing.  Physical exam is pertinent for no midline cervical, thoracic, or lumbar tenderness palpation.  No bony step-offs or deformities.  Patient has lower bilateral paraspinal lumbar tenderness palpation.  There is no overlying skin changes, increased warmth, or erythema to the area.  Strength is 5-5 in patient's upper and lower bilateral extremities.  Sensation intact throughout.  He has palpable radial, DP, and PT pulses.  Left knee is nontender to palpation.  Tattoos are the only overlying skin change noted.  Compartments are soft. The patient can change positions from sitting to  standing with ease and is ambulatory with ease as well. ? ?We will order basic imaging as the patient does not have any trauma to the area. ? ?I independently reviewed and interpreted the patient's labs and imaging an

## 2022-02-10 NOTE — Discharge Instructions (Addendum)
You were seen in the emergency department for evaluation of your low back pain and left knee pain.  Your imaging was normal.  I likely make you have something called sciatica given your pain.  I have prescribed you some ibuprofen 600 mg to take every 6 hours.  Please take this as directed for the next 48 hours and then as needed after that.  Additionally, I prescribed you Robaxin which is a muscle relaxer.  Please do not operate heavy machinery or drive on this medication as it can make you fatigued.  I have also prescribed you lidocaine patches to apply to the skin as they were today to use as needed.  Additionally, I have included information for [facility] primary care offices to call to get established with below.  If you have bowel or bladder problems, any fever, or weakness to the leg, or increased pain, please return to the nearest emergency department for reevaluation. ? ?Contact a doctor if: ?You have pain that: ?Wakes you up when you are sleeping. ?Gets worse when you lie down. ?Is worse than the pain you have had in the past. ?Lasts longer than 4 weeks. ?You lose weight without trying. ?Get help right away if: ?You cannot control when you pee (urinate) or poop (have a bowel movement). ?You have weakness in any of these areas and it gets worse: ?Lower back. ?The area between your hip bones. ?Butt. ?Legs. ?You have redness or swelling of your back. ?You have a burning feeling when you pee. ? ? ?[facility] Primary Care Doctor List ? ? ?Tula Nakayama, MD. Specialty: Family Medicine Contact information: [address]  ?[place] [address]  ?[phone]  ? ?Sallee Lange, MD. Specialty: Family Medicine Contact information: [address]  ?[address]  ?[place] [address]  ?[phone]  ? ?Rosita Fire, MD Specialty: Internal Medicine Contact information: [place]  ?[place] [address]  ?[phone]  ? ?Delphina Cahill, MD. Specialty: Internal Medicine Contact information: [place]  ?[place] [address]  ?[phone]  ? ? [facility] (Dr. Maudie Mercury) Specialty: Family Medicine Contact information: [phone]  ?[place] [address]  ?[phone]  ? ?Leslie Andrea, MD. Specialty: Family Medicine Contact information: [place]  ?[place] [address]  ?[phone]  ? ?Asencion Noble, MD. Specialty: Internal Medicine Contact information: [place]  ?[address]  ?[place] [address]  ?[phone]  ? ? ?[facility] ? ?[address] ?[place], [place] [address] ?[phone] ? ?Services ?[facility] offers a variety of basic health services. ? ?Services include but are not limited to: ?Blood pressure checks  ?Heart rate checks  ?Blood sugar checks  ?Urine analysis  ?Rapid strep tests  ?Pregnancy tests.  ?Health education and referrals ? ?People needing more complex services will be directed to a physician online. Using these virtual visits, doctors can evaluate and prescribe medicine and treatments. ?There will be no medication on-site, though [facility] will help patients fill their prescriptions at little to no cost. ? ? ?For More information please go to: ?[email or web address] ? ?

## 2022-12-02 IMAGING — DX DG FEMUR 2+V*L*
4 series · 4 of 4 positions shown · non-contrast
Comparison: Knee radiographs 02/10/2022.

CLINICAL DATA: Chondroid lesion in the left distal femur on knee
radiographs with partial exclusion of the lesion on some views.
Today's femur exam is for further characterization.

EXAM:
LEFT FEMUR 2 VIEWS

[femur ap (1 of 2)]
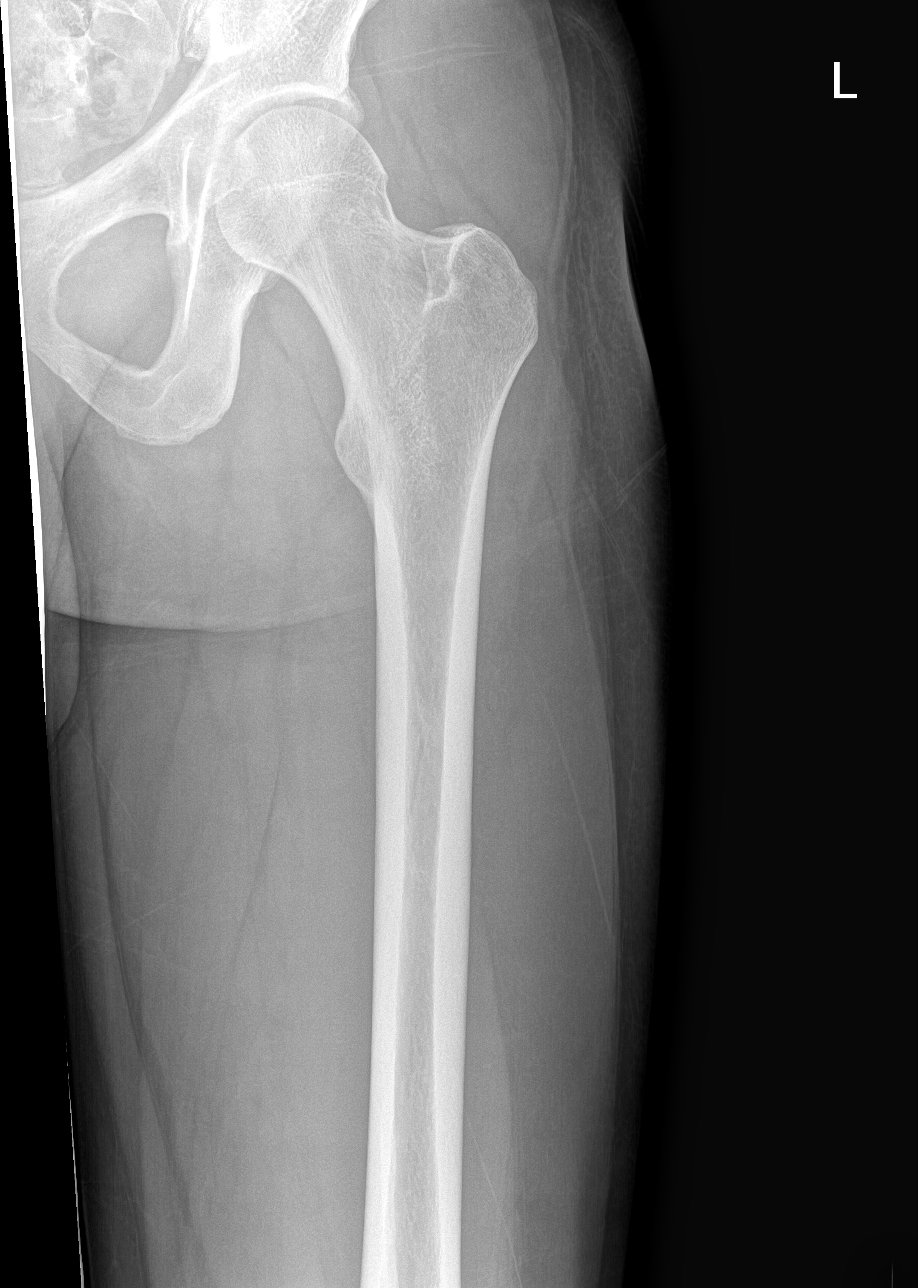

[femur ap (2 of 2)]
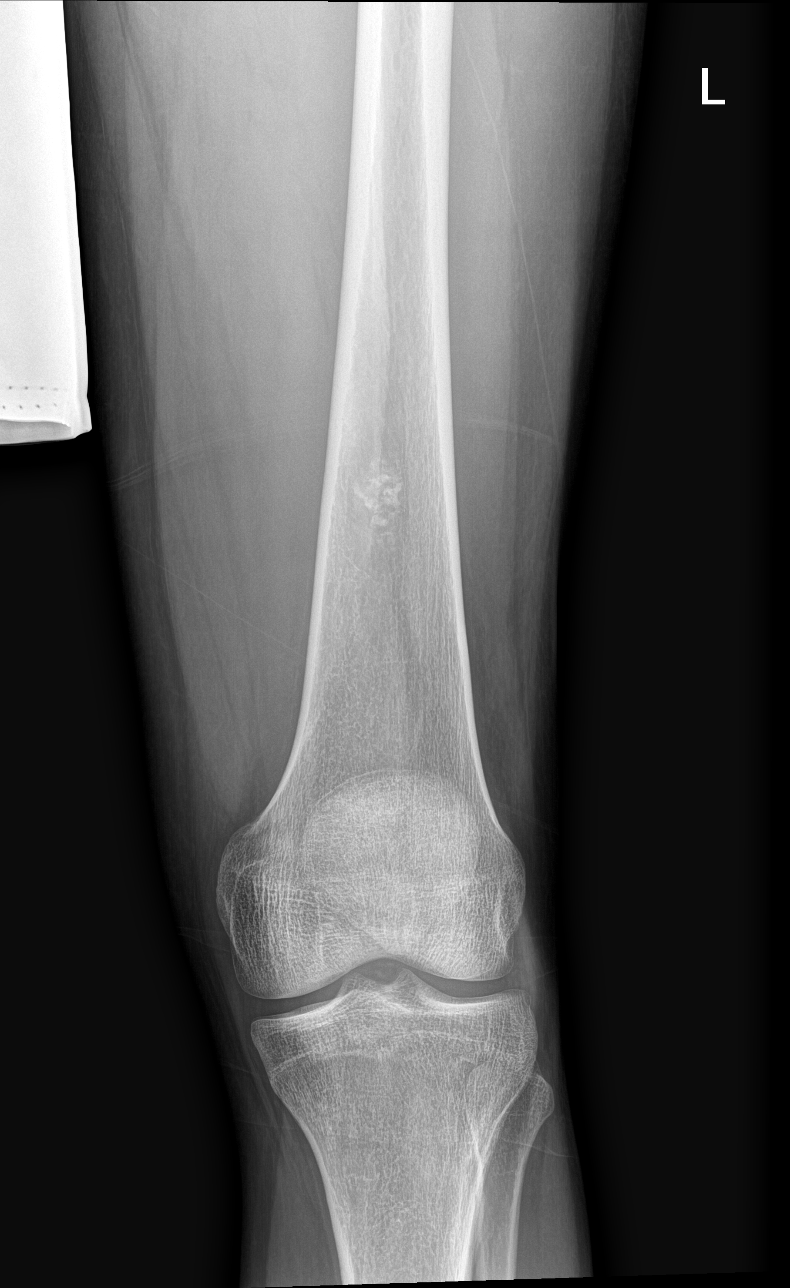

[femur lat (1 of 2)]
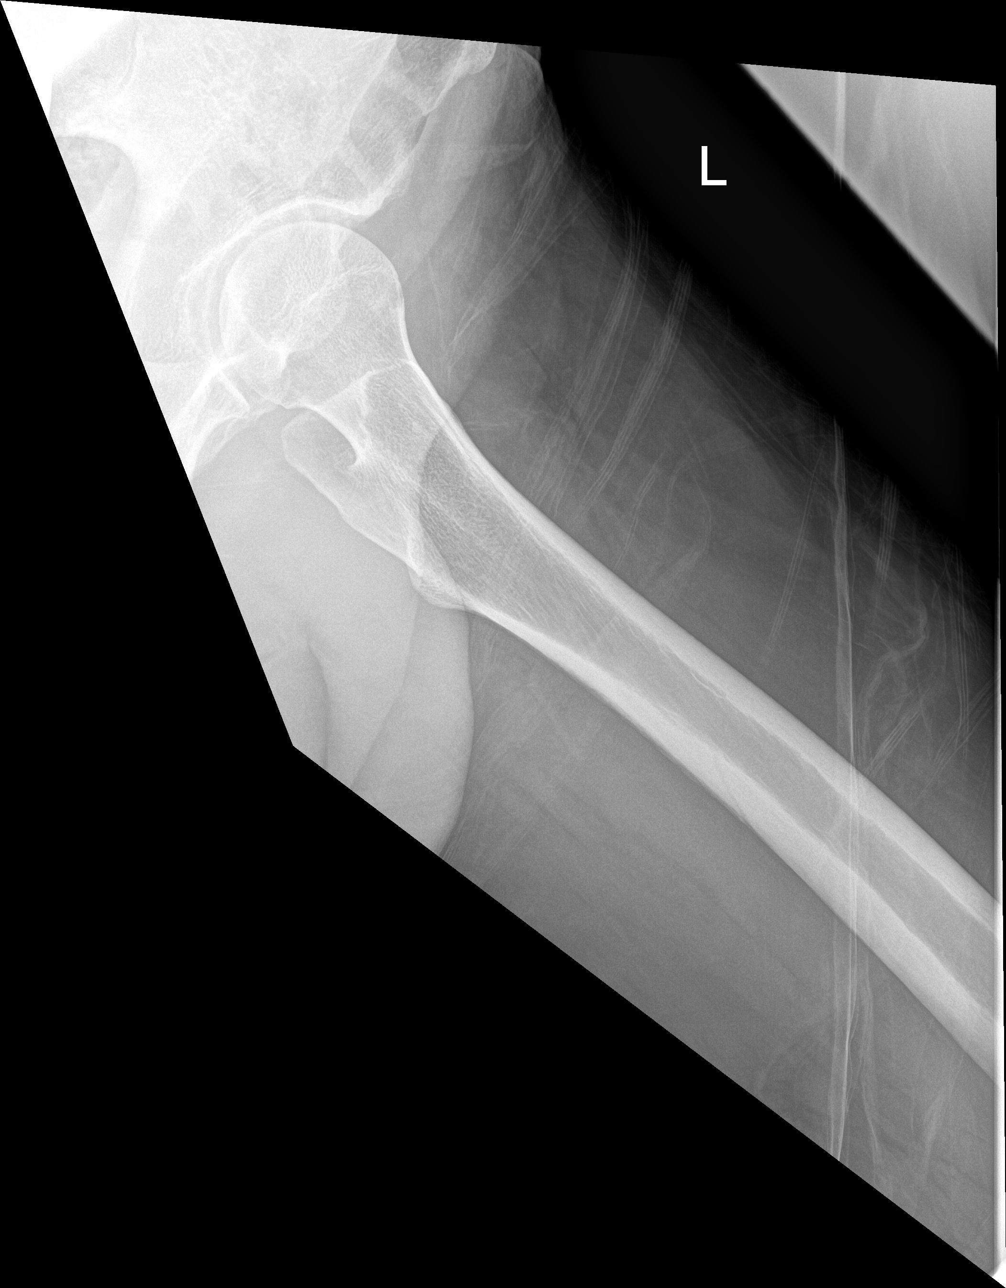

[femur lat (2 of 2)]
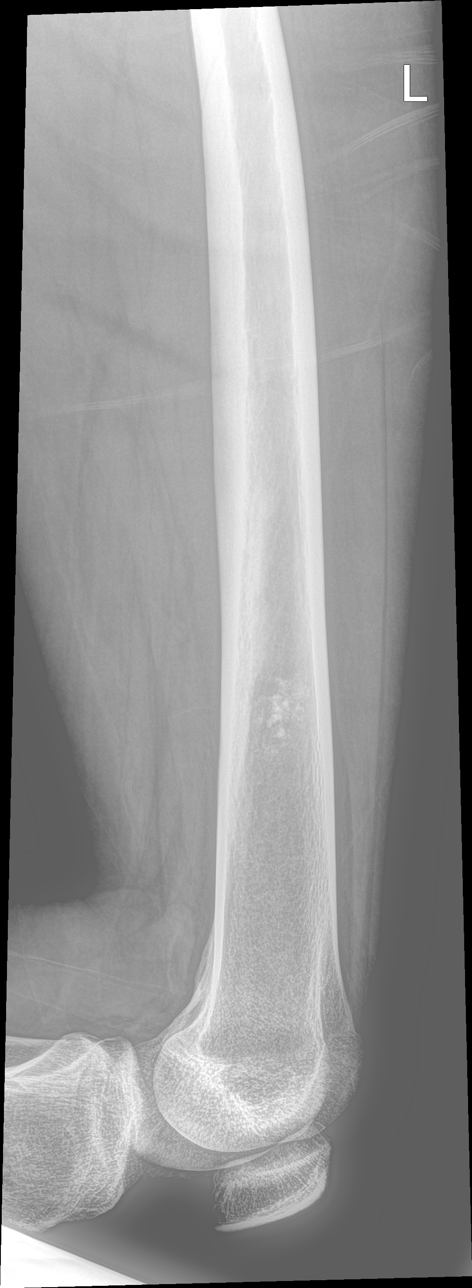

[4 of 4 positions shown; findings below may reference images not displayed]

FINDINGS: Benign appearing 2.7 by 1.4 by 1.0 cm lesion with chondroid matrix
in the distal femoral diaphysis corresponding to the lesion
partially characterized on the radiographs. No endosteal scalloping,
pathologic fracture, periosteal reaction, or transition zone
abnormality the appearance is compatible with enchondroma and does
not require further workup. No other femur lesion is identified.
IMPRESSION: 1. Enchondroma of the distal femoral diaphysis with benign imaging
characteristics. This lesion does not require further workup.

## 2022-12-02 IMAGING — DX DG KNEE COMPLETE 4+V*L*
4 series · 4 of 4 positions shown · non-contrast
Comparison: Lumbar spine evaluation of the same date. No additional
prior imaging for comparison.

CLINICAL DATA: 35-year-old male presenting with pain in the LEFT
lower back into LEFT knee for 2 days without history of injury.

EXAM:
LEFT KNEE - COMPLETE 4+ VIEW

[knee ap]
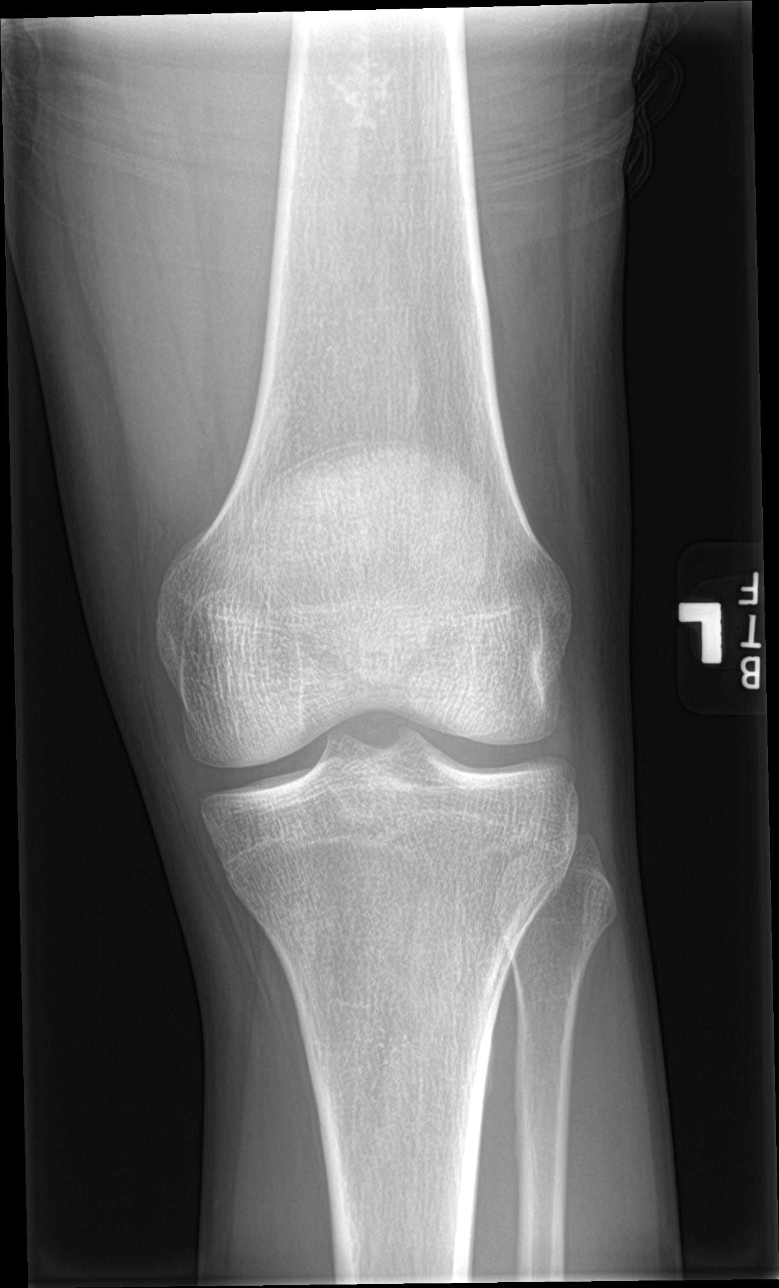

[knee obl (1 of 2)]
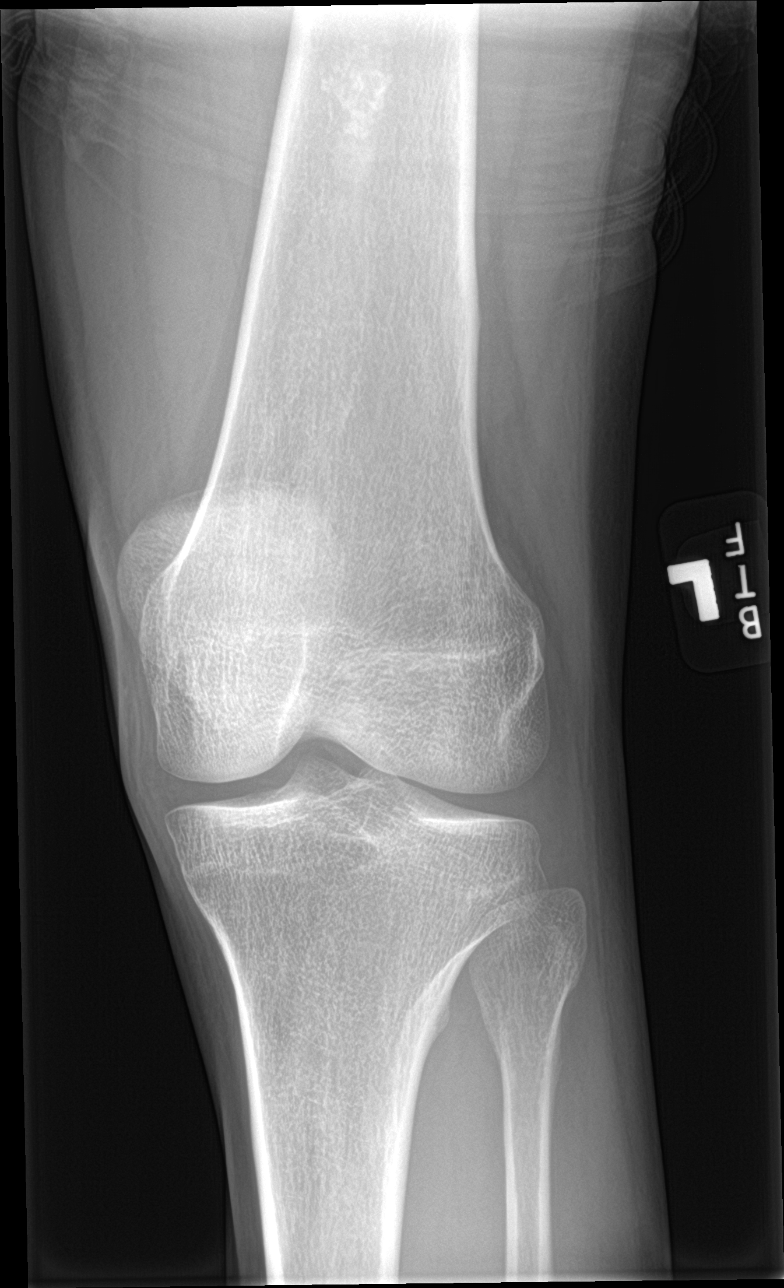

[knee obl (2 of 2)]
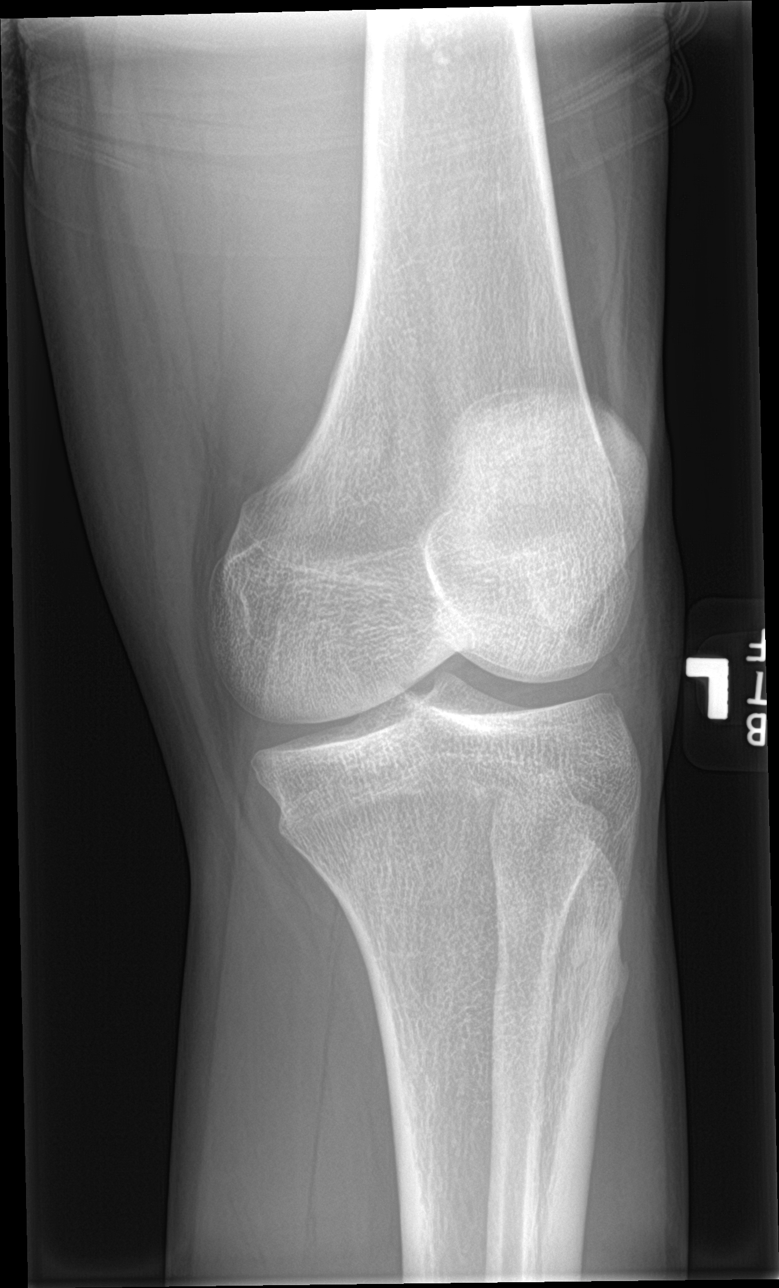

[knee lat]
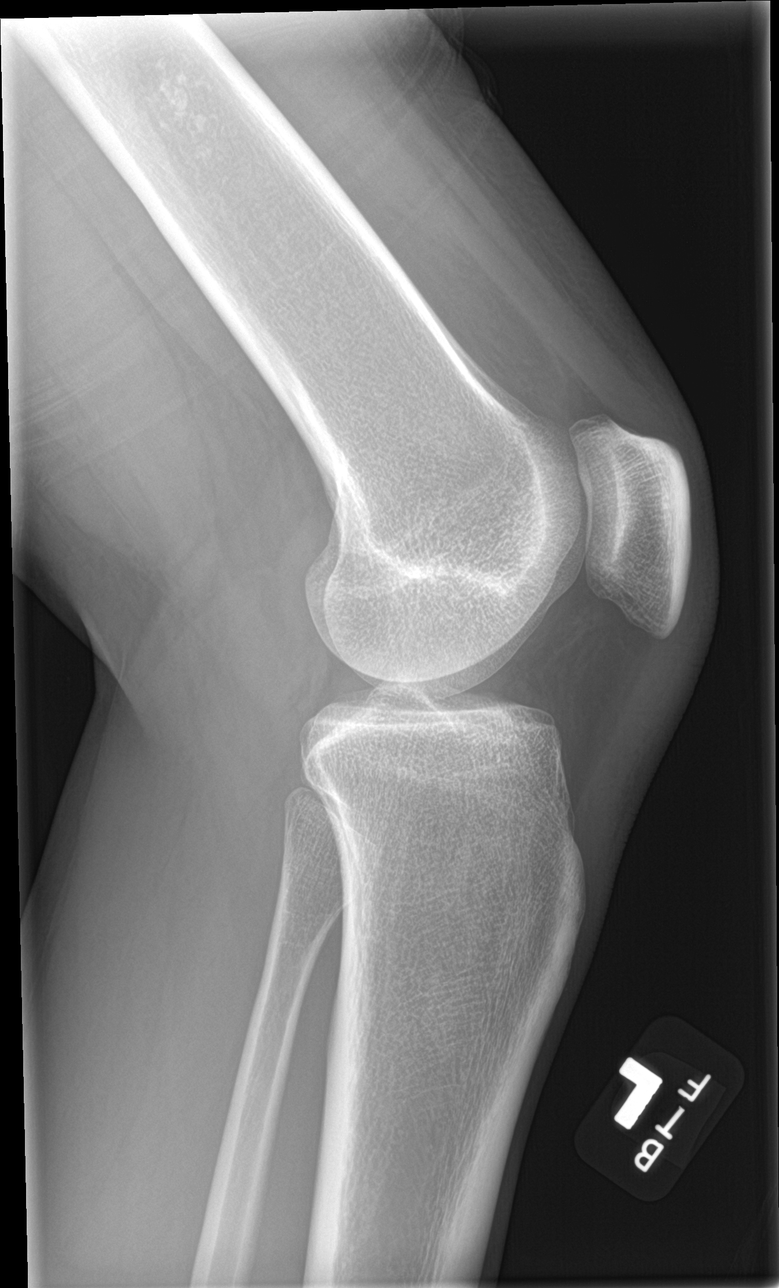

[4 of 4 positions shown; findings below may reference images not displayed]

FINDINGS: No acute fracture or sign of dislocation. No joint effusion or soft
tissue swelling.

Benign appearing chondroid lesion in the distal LEFT femur
incompletely imaged on some views.
IMPRESSION: 1. No acute abnormality in the LEFT knee.
2. Benign-appearing chondroid lesion in the distal LEFT femur
incompletely imaged on some views. If pain is localized mainly to
the distal thigh and knee could consider dedicated femoral
evaluation for complete assessment of this lesion and or short
interval follow-up. Finding is likely a small enchondroma.
